# Patient Record
Sex: Male | Born: 1935 | Race: White | Hispanic: No | Marital: Married | Smoking: Never smoker
Health system: Southern US, Community
[De-identification: ages and names within clinical notes are randomized; demographics above are authoritative.]

## PROBLEM LIST (undated history)

## (undated) DIAGNOSIS — I4891 Unspecified atrial fibrillation: Secondary | ICD-10-CM

## (undated) DIAGNOSIS — H9190 Unspecified hearing loss, unspecified ear: Secondary | ICD-10-CM

## (undated) DIAGNOSIS — K219 Gastro-esophageal reflux disease without esophagitis: Secondary | ICD-10-CM

## (undated) DIAGNOSIS — I10 Essential (primary) hypertension: Secondary | ICD-10-CM

## (undated) DIAGNOSIS — Z87442 Personal history of urinary calculi: Secondary | ICD-10-CM

## (undated) DIAGNOSIS — B1910 Unspecified viral hepatitis B without hepatic coma: Secondary | ICD-10-CM

## (undated) DIAGNOSIS — F0390 Unspecified dementia without behavioral disturbance: Secondary | ICD-10-CM

## (undated) DIAGNOSIS — Z7901 Long term (current) use of anticoagulants: Secondary | ICD-10-CM

## (undated) DIAGNOSIS — I509 Heart failure, unspecified: Secondary | ICD-10-CM

## (undated) DIAGNOSIS — C4490 Unspecified malignant neoplasm of skin, unspecified: Secondary | ICD-10-CM

## (undated) DIAGNOSIS — R6251 Failure to thrive (child): Secondary | ICD-10-CM

## (undated) DIAGNOSIS — M1990 Unspecified osteoarthritis, unspecified site: Secondary | ICD-10-CM

## (undated) HISTORY — PX: LITHOTRIPSY: SUR834

## (undated) HISTORY — PX: CARDIOVERSION: SHX1299

## (undated) HISTORY — PX: CYSTOSCOPY W/ STONE MANIPULATION: SHX1427

## (undated) HISTORY — PX: SKIN CANCER EXCISION: SHX779

---

## 2017-01-10 ENCOUNTER — Ambulatory Visit (INDEPENDENT_AMBULATORY_CARE_PROVIDER_SITE_OTHER): Payer: No Typology Code available for payment source

## 2017-01-10 ENCOUNTER — Ambulatory Visit (INDEPENDENT_AMBULATORY_CARE_PROVIDER_SITE_OTHER): Payer: Medicare Other | Admitting: Orthopaedic Surgery

## 2017-01-10 DIAGNOSIS — S42352D Displaced comminuted fracture of shaft of humerus, left arm, subsequent encounter for fracture with routine healing: Secondary | ICD-10-CM

## 2017-01-10 DIAGNOSIS — M79622 Pain in left upper arm: Secondary | ICD-10-CM

## 2017-01-10 DIAGNOSIS — M898X2 Other specified disorders of bone, upper arm: Secondary | ICD-10-CM

## 2017-01-10 NOTE — Progress Notes (Signed)
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 He unfortunately has some mild dementia in about a month ago sustained a significant mechanical fall injuring his left humeral shaft. He's been treated appropriately in splinting for this type of fracture that I was able to review his x-rays and see the had but there was significant swelling to be expected bruising as well as edema in the soft tissues with drainage. He's been in a sling and swath as well as dressing changes. His wife brought him to be adequate concern about his fracture in his care overall. I gave her reassurance that this is a fracture that I would even try to treat nonoperatively as well given the extent of the fracture and how these can do over time with healing.  On exam there significant bruising around his humerus, chest, shoulder and down the arm. His hand is well-perfused on the left side. He is unable to extend his fingers and wrist on the left side suggesting a radial nerve injury.  2 views of the humerus obtained and I share with him and his wife the x-ray showing a significant humeral shaft fracture. The overall gross alignment well-maintained.  This is a a fracture that I would treat nonoperatively as well. At a month though I can place him in a Sarmiento fracture brace and I didn't place his brace today and he was comfortable in this. I will give the nursing facility to have him work on range of motion of his elbow wrist and hand and I like see him back in 3 weeks with a repeat AP and lateral of the left humerus out of his splint. All questions were encouraged and answered.

## 2017-01-23 ENCOUNTER — Encounter (HOSPITAL_COMMUNITY): Payer: Self-pay | Admitting: Neurology

## 2017-01-23 ENCOUNTER — Other Ambulatory Visit: Payer: Self-pay

## 2017-01-23 ENCOUNTER — Emergency Department (HOSPITAL_COMMUNITY): Payer: Medicare Other

## 2017-01-23 ENCOUNTER — Observation Stay (HOSPITAL_COMMUNITY)
Admission: EM | Admit: 2017-01-23 | Discharge: 2017-01-24 | Disposition: A | Discharge status: Home / Self Care | Payer: Medicare Other | Attending: Internal Medicine | Admitting: Internal Medicine

## 2017-01-23 ENCOUNTER — Ambulatory Visit: Payer: Medicare Other | Admitting: Podiatry

## 2017-01-23 DIAGNOSIS — S42292D Other displaced fracture of upper end of left humerus, subsequent encounter for fracture with routine healing: Secondary | ICD-10-CM | POA: Diagnosis not present

## 2017-01-23 DIAGNOSIS — M1990 Unspecified osteoarthritis, unspecified site: Secondary | ICD-10-CM | POA: Insufficient documentation

## 2017-01-23 DIAGNOSIS — Z7901 Long term (current) use of anticoagulants: Secondary | ICD-10-CM | POA: Insufficient documentation

## 2017-01-23 DIAGNOSIS — R55 Syncope and collapse: Principal | ICD-10-CM | POA: Diagnosis present

## 2017-01-23 DIAGNOSIS — I429 Cardiomyopathy, unspecified: Secondary | ICD-10-CM | POA: Diagnosis not present

## 2017-01-23 DIAGNOSIS — X58XXXD Exposure to other specified factors, subsequent encounter: Secondary | ICD-10-CM | POA: Diagnosis not present

## 2017-01-23 DIAGNOSIS — Z79899 Other long term (current) drug therapy: Secondary | ICD-10-CM | POA: Insufficient documentation

## 2017-01-23 DIAGNOSIS — I700 Atherosclerosis of aorta: Secondary | ICD-10-CM | POA: Diagnosis not present

## 2017-01-23 DIAGNOSIS — I110 Hypertensive heart disease with heart failure: Secondary | ICD-10-CM | POA: Insufficient documentation

## 2017-01-23 DIAGNOSIS — I5020 Unspecified systolic (congestive) heart failure: Secondary | ICD-10-CM | POA: Insufficient documentation

## 2017-01-23 DIAGNOSIS — I081 Rheumatic disorders of both mitral and tricuspid valves: Secondary | ICD-10-CM | POA: Diagnosis not present

## 2017-01-23 DIAGNOSIS — N390 Urinary tract infection, site not specified: Secondary | ICD-10-CM | POA: Diagnosis not present

## 2017-01-23 DIAGNOSIS — Z885 Allergy status to narcotic agent status: Secondary | ICD-10-CM | POA: Diagnosis not present

## 2017-01-23 DIAGNOSIS — Z87442 Personal history of urinary calculi: Secondary | ICD-10-CM | POA: Insufficient documentation

## 2017-01-23 DIAGNOSIS — I4891 Unspecified atrial fibrillation: Secondary | ICD-10-CM

## 2017-01-23 DIAGNOSIS — T17908A Unspecified foreign body in respiratory tract, part unspecified causing other injury, initial encounter: Secondary | ICD-10-CM

## 2017-01-23 DIAGNOSIS — Z85828 Personal history of other malignant neoplasm of skin: Secondary | ICD-10-CM | POA: Diagnosis not present

## 2017-01-23 DIAGNOSIS — I481 Persistent atrial fibrillation: Secondary | ICD-10-CM | POA: Insufficient documentation

## 2017-01-23 DIAGNOSIS — I509 Heart failure, unspecified: Secondary | ICD-10-CM | POA: Diagnosis not present

## 2017-01-23 DIAGNOSIS — E860 Dehydration: Secondary | ICD-10-CM

## 2017-01-23 HISTORY — DX: Unspecified viral hepatitis B without hepatic coma: B19.10

## 2017-01-23 HISTORY — DX: Unspecified malignant neoplasm of skin, unspecified: C44.90

## 2017-01-23 HISTORY — DX: Gastro-esophageal reflux disease without esophagitis: K21.9

## 2017-01-23 HISTORY — DX: Essential (primary) hypertension: I10

## 2017-01-23 HISTORY — DX: Long term (current) use of anticoagulants: Z79.01

## 2017-01-23 HISTORY — DX: Failure to thrive (child): R62.51

## 2017-01-23 HISTORY — DX: Heart failure, unspecified: I50.9

## 2017-01-23 HISTORY — DX: Unspecified atrial fibrillation: I48.91

## 2017-01-23 HISTORY — DX: Unspecified dementia, unspecified severity, without behavioral disturbance, psychotic disturbance, mood disturbance, and anxiety: F03.90

## 2017-01-23 HISTORY — DX: Unspecified osteoarthritis, unspecified site: M19.90

## 2017-01-23 HISTORY — DX: Unspecified hearing loss, unspecified ear: H91.90

## 2017-01-23 HISTORY — DX: Personal history of urinary calculi: Z87.442

## 2017-01-23 LAB — CBC
HCT: 49.8 % (ref 39.0–52.0)
HEMOGLOBIN: 17.4 g/dL — AB (ref 13.0–17.0)
MCH: 33.2 pg (ref 26.0–34.0)
MCHC: 34.9 g/dL (ref 30.0–36.0)
MCV: 95 fL (ref 78.0–100.0)
Platelets: 140 10*3/uL — ABNORMAL LOW (ref 150–400)
RBC: 5.24 MIL/uL (ref 4.22–5.81)
RDW: 16.3 % — ABNORMAL HIGH (ref 11.5–15.5)
WBC: 9.1 10*3/uL (ref 4.0–10.5)

## 2017-01-23 LAB — BASIC METABOLIC PANEL
ANION GAP: 10 (ref 5–15)
BUN: 30 mg/dL — ABNORMAL HIGH (ref 6–20)
CALCIUM: 8.8 mg/dL — AB (ref 8.9–10.3)
CHLORIDE: 112 mmol/L — AB (ref 101–111)
CO2: 21 mmol/L — AB (ref 22–32)
Creatinine, Ser: 1.47 mg/dL — ABNORMAL HIGH (ref 0.61–1.24)
GFR calc Af Amer: 48 mL/min — ABNORMAL LOW (ref >60)
GFR calc non Af Amer: 42 mL/min — ABNORMAL LOW (ref >60)
GLUCOSE: 114 mg/dL — AB (ref 65–99)
Potassium: 3.8 mmol/L (ref 3.5–5.1)
Sodium: 143 mmol/L (ref 135–145)

## 2017-01-23 LAB — PROTIME-INR
INR: 2.46
PROTHROMBIN TIME: 27.1 s — AB (ref 11.4–15.2)

## 2017-01-23 MED ORDER — SODIUM CHLORIDE 0.9% FLUSH
3.0000 mL | Freq: Two times a day (BID) | INTRAVENOUS | Status: DC
  Administered 2017-01-24: 3 mL via INTRAVENOUS

## 2017-01-23 MED ORDER — SOTALOL HCL 120 MG PO TABS: 120.0000 mg | ORAL_TABLET | Freq: Two times a day (BID) | ORAL | Status: DC

## 2017-01-23 MED ORDER — SODIUM CHLORIDE 0.9% FLUSH
3.0000 mL | Freq: Two times a day (BID) | INTRAVENOUS | Status: DC
  Administered 2017-01-23 – 2017-01-24 (×2): 3 mL via INTRAVENOUS

## 2017-01-23 MED ORDER — SODIUM CHLORIDE 0.9 % IV BOLUS (SEPSIS): 1000.0000 mL | Freq: Once | INTRAVENOUS | Status: DC

## 2017-01-23 MED ORDER — TRAMADOL HCL 50 MG PO TABS: 50.0000 mg | ORAL_TABLET | Freq: Four times a day (QID) | ORAL | Status: DC | PRN

## 2017-01-23 MED ORDER — MEGESTROL ACETATE 40 MG/ML PO SUSP: 400.0000 mg | ORAL | Status: DC

## 2017-01-23 MED ORDER — ACETAMINOPHEN 500 MG PO TABS
1000.0000 mg | ORAL_TABLET | Freq: Four times a day (QID) | ORAL | Status: DC | PRN
Start: 2017-01-23 — End: 2017-01-24
  Administered 2017-01-23: 1000 mg via ORAL
  Filled 2017-01-23: qty 2

## 2017-01-23 MED ORDER — WARFARIN SODIUM 2.5 MG PO TABS
2.5000 mg | ORAL_TABLET | Freq: Every day | ORAL | Status: DC
  Administered 2017-01-23 – 2017-01-24 (×2): 2.5 mg via ORAL
  Filled 2017-01-23 (×2): qty 1

## 2017-01-23 MED ORDER — DILTIAZEM HCL 60 MG PO TABS
60.0000 mg | ORAL_TABLET | Freq: Three times a day (TID) | ORAL | Status: DC
  Administered 2017-01-23 – 2017-01-24 (×4): 60 mg via ORAL
  Filled 2017-01-23 (×5): qty 1

## 2017-01-23 MED ORDER — WARFARIN - PHYSICIAN DOSING INPATIENT: Freq: Every day | Status: DC

## 2017-01-23 MED ORDER — SODIUM CHLORIDE 0.9% FLUSH: 3.0000 mL | INTRAVENOUS | Status: DC | PRN

## 2017-01-23 MED ORDER — SOTALOL HCL 80 MG PO TABS
120.0000 mg | ORAL_TABLET | Freq: Two times a day (BID) | ORAL | Status: DC
  Administered 2017-01-23 – 2017-01-24 (×3): 120 mg via ORAL
  Filled 2017-01-23: qty 1
  Filled 2017-01-23 (×2): qty 2
  Filled 2017-01-23: qty 1

## 2017-01-23 MED ORDER — DIGOXIN 125 MCG PO TABS
0.1250 mg | ORAL_TABLET | Freq: Every day | ORAL | Status: DC
  Administered 2017-01-23 – 2017-01-24 (×2): 0.125 mg via ORAL
  Filled 2017-01-23 (×2): qty 1

## 2017-01-23 MED ORDER — SODIUM CHLORIDE 0.9 % IV SOLN: 250.0000 mL | INTRAVENOUS | Status: DC | PRN

## 2017-01-23 MED ORDER — CELECOXIB 100 MG PO CAPS
100.0000 mg | ORAL_CAPSULE | Freq: Two times a day (BID) | ORAL | Status: DC
  Administered 2017-01-23 – 2017-01-24 (×2): 100 mg via ORAL
  Filled 2017-01-23 (×3): qty 1

## 2017-01-23 MED ORDER — FUROSEMIDE 40 MG PO TABS
40.0000 mg | ORAL_TABLET | Freq: Two times a day (BID) | ORAL | Status: DC
  Administered 2017-01-23 (×2): 40 mg via ORAL
  Filled 2017-01-23 (×2): qty 2

## 2017-01-23 NOTE — ED Notes (Signed)
Dr. Kathrynn Humble reports patient can eat. Meal tray ordered, will get apple sauce, more juice. Also med, hx has been done, so home meds can be ordered. AFIB HR fluctuating 105-130.

## 2017-01-23 NOTE — H&P (Signed)
Date: 01/23/2017               Patient Name:  Devin Scott MRN: [id number]  DOB: [birth year] Age / Sex: 83 y.o., male   PCP: Nicola Girt, DO         Medical Service: Internal Medicine Teaching Service         Attending Physician: Dr. Oval Linsey, MD    First Contact: Dr. Colbert Ewing Pager: [phone]  Second Contact: Dr. Maryellen Pile Pager: [phone]       After Hours (After 5p/  First Contact Pager: [phone]  weekends / holidays): Second Contact Pager: [phone]   Chief Complaint: syncopal episode  History of Present Illness: Devin Scott is an 83yo male with PMH significant for NICM (EF 20%), cognitive impairment, afib (on warfarin), and HTN who presents from his facility for syncopal episode this morning. History obtained from patient and wife, although wife was not present during the syncopal episode and patient does not fully remember the details.  Patient was reportedly sitting in his wheelchair waiting for his appointment when he was found to be unresponsive and slumped over. Nurse had a hard time feeling his pulse. They laid him back placed patient back in bed, but he became more responsive and started feeling better. Patient states that he felt very weak and felt like he was going to pass out but never actually did pass out. Wife says she arrived around 15 minutes after this happened and he was back to his normal state lying in bed. Wife also says that he still had egg in his mouth. Patient denies CP, palpitations, vision changes, or dizziness.  In the ER, he was noted to be in afib with RVR (HR 120s-140s). Temp 97.4, RR 14, BP 99/76, 97% on RA. Given home diltiazem 60mg  and sotalol 120mg , as well as digoxin 0.125mg . HR down to 80s to 100s. During the time of my interview, patient was feeling fine. No recurrence of syncope.  Meds:  Current Meds  Medication Sig  . acetaminophen (TYLENOL) 500 MG tablet Take 1,000 mg by mouth 3 (three) times daily.  . celecoxib  (CELEBREX) 100 MG capsule Take 100 mg by mouth 2 (two) times daily.  . digoxin (LANOXIN) 0.125 MG tablet Take 0.125 mg by mouth daily.  Marland Kitchen diltiazem (CARDIZEM) 60 MG tablet Take 60 mg by mouth 3 (three) times daily.  . furosemide (LASIX) 40 MG tablet Take 40 mg by mouth 2 (two) times daily.  . megestrol (MEGACE) 40 MG/ML suspension Take 400 mg by mouth every other day.  Marland Kitchen omeprazole (PRILOSEC) 40 MG capsule Take 40 mg by mouth daily.  Marland Kitchen OVER THE COUNTER MEDICATION Take 120 mLs by mouth 3 (three) times daily.  . sotalol (BETAPACE) 120 MG tablet Take 120 mg by mouth 2 (two) times daily.  . traMADol (ULTRAM) 50 MG tablet Take 50 mg by mouth every 6 (six) hours as needed for moderate pain.  Marland Kitchen warfarin (COUMADIN) 2.5 MG tablet Take 2.5 mg by mouth daily.   Allergies: Allergies as of 01/23/2017  . (No Known Allergies)   Past Medical History:  Diagnosis Date  . Arthritis   . Atrial fibrillation ([place])   . Chronic anticoagulation   . Failure to thrive (0-17)   . Hard of hearing   . Hypertension   . Kidney stone   . Kidney stone   . Skin cancer    Family History: No family history on file.  Social History: Social  History   Social History  . Marital status: Unknown    Spouse name: N/A  . Number of children: N/A  . Years of education: N/A   Occupational History  . Not on file.   Social History Main Topics  . Smoking status: Never Smoker  . Smokeless tobacco: Not on file  . Alcohol use No  . Drug use: Unknown  . Sexual activity: Not on file   Other Topics Concern  . Not on file   Social History Narrative  . No narrative on file   Review of Systems: Constitutional: Negative for diaphoresis, fever, malaise/fatigue, and weight loss. HEENT: Negative for blurred vision, sinus pain, congestion, and sore throat. Positive for hearing loss. Respiratory: Negative for cough, shortness of breath and wheezing. Cardiovascular: Negative for chest pain, palpitations, orthopnea, PND, and  leg swelling. Gastrointestinal: Negative for abdominal pain, blood in stool, constipation, diarrhea, heartburn, nausea and vomiting. Genitourinary: Negative for dysuria and hematuria. Musculoskeletal: Negative for joint pain and myalgias. Neurological: Negative for dizziness, focal weakness, weakness and headaches.  Physical Exam: Blood pressure 100/72, pulse 67, temperature (!) 97.4 F (36.3 C), temperature source Oral, resp. rate 16, SpO2 96 %. GEN: Well-appearing elderly gentleman lying in bed in NAD HENT: Moist mucous membranes. No visible lesions. EYES: EOMI. Sclera anicteric. RESP: Clear to auscultation bilaterally. No wheezes or rales. CV: Irregularly irregular rhythm. Tachycardic. No murmurs, gallops, or rubs. No JVD elevation appreciated. 3+ BLE edema to knees. ABD: Soft. Non-tender. Non-distended. Normoactive bowel sounds. EXT: BLE edema to knees. 1+ DP and radial pulses. Minimal movement of L arm (recent history of L humeral fracture). NEURO: A&Ox2 (did not know the month). Mild hearing loss. Other cranial nerves grossly intact. No focal deficits or weakness.  EKG: Afib with RVR  CXR: Focal consolidation left base. Question aspiration as a differential consideration. Small left pleural effusion. Small right pleural effusion. Cardiomegaly with aortic atherosclerosis. Comminuted fracture proximal mid left humerus. Aortic Atherosclerosis  Assessment & Plan by Problem: Active Problems:   Syncope  # Syncope with ?LOC Etiology: Dehydration vs afib. History is difficult to obtain from patient and wife. One episode of syncope this morning. Patient does seem dry on exam. Cr 1.47 and BUN 30 (baseline Cr in 1-1.3 range). Patient also has a history of persistent atrial fibrillation and was noted to be in afib with RVR in the ER. No other electrolyte abnormalities. History does not really fit a vasovagal etiology. Patient does not recall passing out, and is feeling back to normal  now. - Orthostatics - Echo - Continue digoxin, sotalol, and diltiazem - Telemetry - UA - IVF  # Persistent Afib with RVR HR improved after diltiazem, digoxin, and sotalol. Patient denies chest pain or palpitations. - Continue digoxin, sotalol, and diltiazem - Telemetry - Cont home warfarin  # HFrEF (last EF 20%) Patient with history of chronic HFrEF. Last EF from note in 09/2016 was reportedly 20%. Patient has refused ICD in prior discussions. Received 1 dose of lasix in ER. Unclear whether he is actually volume overloaded. - Repeat echo - Orthostatics  # HTN History of HTN per chart. Blood pressures low in the hospital (100s/80s). - Hold anti-hypertensives for now.  Dispo: Admit patient to Observation with expected length of stay less than 2 midnights.  Signed: Colbert Ewing, MD  Internal Medicine, PGY-1 01/23/2017, 4:49 PM  Pager: Mamie Nick [phone]

## 2017-01-23 NOTE — ED Triage Notes (Signed)
Per ems- pt comes from [facility], today he reports they took him eat, he was sitting up. He said he was feeling well, and felt he was going to pass out. They made him eat. He had syncopal episode, went unresponsive, apneic, weak pulse. Ems arrived, he was alert and oriented. Skin color intact, delayed cap refill. BP 104/58, HR 124 AFIB, CBG 122. Has been having low BP, and doctor has been unable to get BP higher. 20 gauge R. Forearm.   Given 400 NS cc

## 2017-01-23 NOTE — ED Notes (Signed)
Attempted to call report; nurse did not answer phone. Secretary states will have nurse call me back.

## 2017-01-23 NOTE — ED Provider Notes (Signed)
[facility] DEPT Provider Note   CSN: [id number] Arrival date & time: 01/23/17  9381     History   Chief Complaint Chief Complaint  Patient presents with  . Loss of Consciousness    HPI Devin Scott is a 83 y.o. male.  HPI Pt with hx of AF comes in with cc of syncope. Pt also has HTN, and hearing problems. I called [facility] on [address] and they report that pt was waiting in wheelchair for his appointment when the med tech went to give him his meds and noted that pt was unresponsive. Charge nurse went to attend the patient and he was appeic, pale and had faint pulse. They placed the patient in the bed, thinking he was going to need CPR - but pt started getting better and more responsive.  Pt has no complains. He doesn't recall passing out.   Past Medical History:  Diagnosis Date  . Arthritis   . Atrial fibrillation ([place])   . Chronic anticoagulation   . Failure to thrive (0-17)   . Hard of hearing   . Hypertension   . Kidney stone   . Kidney stone   . Skin cancer     Patient Active Problem List   Diagnosis Date Noted  . Displaced comminuted fracture of shaft of left humerus with routine healing 01/10/2017    Past Surgical History:  Procedure Laterality Date  . CARDIOVERSION    . KIDNEY STONE SURGERY         Home Medications    Prior to Admission medications   Medication Sig Start Date End Date Taking? Authorizing Provider  acetaminophen (TYLENOL) 500 MG tablet Take 1,000 mg by mouth 3 (three) times daily.   Yes [provider]  celecoxib (CELEBREX) 100 MG capsule Take 100 mg by mouth 2 (two) times daily.   Yes [provider]  digoxin (LANOXIN) 0.125 MG tablet Take 0.125 mg by mouth daily.   Yes [provider]  diltiazem (CARDIZEM) 60 MG tablet Take 60 mg by mouth 3 (three) times daily.   Yes [provider]  furosemide (LASIX) 40 MG tablet Take 40 mg by mouth 2 (two) times daily.   Yes [provider]    megestrol (MEGACE) 40 MG/ML suspension Take 400 mg by mouth every other day.   Yes [provider]  omeprazole (PRILOSEC) 40 MG capsule Take 40 mg by mouth daily.   Yes [provider]  OVER THE COUNTER MEDICATION Take 120 mLs by mouth 3 (three) times daily.   Yes [provider]  sotalol (BETAPACE) 120 MG tablet Take 120 mg by mouth 2 (two) times daily.   Yes [provider]  traMADol (ULTRAM) 50 MG tablet Take 50 mg by mouth every 6 (six) hours as needed for moderate pain.   Yes [provider]  warfarin (COUMADIN) 2.5 MG tablet Take 2.5 mg by mouth daily.   Yes [provider]    Family History No family history on file.  Social History Social History  Substance Use Topics  . Smoking status: Never Smoker  . Smokeless tobacco: Not on file  . Alcohol use No     Allergies   Patient has no known allergies.   Review of Systems Review of Systems  Constitutional: Positive for activity change.  Respiratory: Negative for shortness of breath.   Cardiovascular: Negative for chest pain.  Allergic/Immunologic: Negative for immunocompromised state.  Neurological: Positive for syncope.  All other systems reviewed and are  negative.    Physical Exam Updated Vital Signs BP 99/63   Pulse 86   Temp (!) 97.4 F (36.3 C) (Oral)   Resp 16   SpO2 96%   Physical Exam  Constitutional: He is oriented to person, place, and time. He appears well-developed.  HENT:  Head: Atraumatic.  Neck: Neck supple.  Cardiovascular: Regular rhythm.   Irregular rhythm  Pulmonary/Chest: Effort normal.  Abdominal: Soft.  Musculoskeletal: He exhibits no edema.  Neurological: He is alert and oriented to person, place, and time.  Skin: Skin is warm.  Nursing note and vitals reviewed.    ED Treatments / Results  Labs (all labs ordered are listed, but only abnormal results are displayed) Labs Reviewed  BASIC METABOLIC PANEL - Abnormal; Notable  for the following:       Result Value   Chloride 112 (*)    CO2 21 (*)    Glucose, Bld 114 (*)    BUN 30 (*)    Creatinine, Ser 1.47 (*)    Calcium 8.8 (*)    GFR calc non Af Amer 42 (*)    GFR calc Af Amer 48 (*)    All other components within normal limits  CBC - Abnormal; Notable for the following:    Hemoglobin 17.4 (*)    RDW 16.3 (*)    Platelets 140 (*)    All other components within normal limits  PROTIME-INR - Abnormal; Notable for the following:    Prothrombin Time 27.1 (*)    All other components within normal limits  URINALYSIS, ROUTINE W REFLEX MICROSCOPIC    EKG  EKG Interpretation  Date/Time:  Monday January 23 2017 09:39:32 EDT Ventricular Rate:  127 PR Interval:    QRS Duration: 84 QT Interval:  310 QTC Calculation: 450 R Axis:   61 Text Interpretation:  Atrial fibrillation with rapid ventricular response ST & T wave abnormality, consider anterior ischemia Abnormal ECG No acute changes No old tracing to compare Confirmed by Varney Biles ([id number]) on 01/23/2017 10:37:02 AM       Radiology Dg Chest 1 View  Result Date: 01/23/2017 CLINICAL DATA:  Hypertension.  Aspiration EXAM: CHEST 1 VIEW COMPARISON:  None. FINDINGS: There is a focus of consolidation in the left base, concerning for pneumonia or aspiration. There is a small pleural effusion on the left. There is a small right pleural effusion without edema or consolidation appreciable. There is cardiomegaly with pulmonary vascularity within normal limits. There is aortic atherosclerosis. No adenopathy. Bones are osteoporotic. There is a comminuted fracture of the left proximal to mid humerus. IMPRESSION: Focal consolidation left base. Question aspiration as a differential consideration. Small left pleural effusion. Small right pleural effusion. Cardiomegaly with aortic atherosclerosis. Comminuted fracture proximal mid left humerus. Aortic Atherosclerosis (ICD10-I70.0). Electronically Signed   By: Lowella Grip  III M.D.   On: 01/23/2017 11:35    Procedures Procedures (including critical care time)  Medications Ordered in ED Medications  digoxin (LANOXIN) tablet 0.125 mg (0.125 mg Oral Given 01/23/17 1435)  diltiazem (CARDIZEM) tablet 60 mg (60 mg Oral Given 01/23/17 1429)  furosemide (LASIX) tablet 40 mg (40 mg Oral Given 01/23/17 1436)  megestrol (MEGACE) 40 MG/ML suspension 400 mg (not administered)  warfarin (COUMADIN) tablet 2.5 mg (not administered)  sotalol (BETAPACE) tablet 120 mg (120 mg Oral Given 01/23/17 1520)     Initial Impression / Assessment and Plan / ED Course  I have reviewed the triage vital signs and the nursing notes.  Pertinent  labs & imaging results that were available during my care of the patient were reviewed by me and considered in my medical decision making (see chart for details).     Pt comes in after an episode of syncope.  The nursing home, for all that it is worth - reports that pt was unconscious, apneic,  Had a faint pulse and they were seconds away from CPR. On my exam, pt is ao x 3. Pt doesn't recall what happened. He doesn't think he fainted. He is noted to be tachycardic and in RVR. The wife, who comes to the bedside later on, has a different version of the story.  Pt has CAD hx, AF hx - and it seems like he was unconscious. We will tx him like syncope and admit.   Final Clinical Impressions(s) / ED Diagnoses   Final diagnoses:  Syncope and collapse  Atrial fibrillation with RVR (HCC)    New Prescriptions New Prescriptions   No medications on file     Varney Biles, MD 01/23/17 [id number]

## 2017-01-23 NOTE — ED Notes (Signed)
Pt given orange juice. Pharmacy working to get accurate med list from facility. Wife at bedside.

## 2017-01-23 NOTE — Discharge Summary (Addendum)
Name: Devin Scott MRN: [id number] DOB: July 17, 1928 83 y.o. PCP: Nicola Girt, DO  Date of Admission: 01/23/2017  9:43 AM Date of Discharge: 01/24/2017 Attending Physician: Oval Linsey, MD  Discharge Diagnosis: 1. Syncope 2. Symptomatic UTI 3. Persistent atrial fibrillation with RVR  Active Problems:   Syncope   Discharge Medications: Allergies as of 01/24/2017      Reactions   Percocet [oxycodone-acetaminophen]    "DO NOT GIVE PERCOCET"/spouse.        Medication List    STOP taking these medications   furosemide 40 MG tablet Commonly known as:  LASIX   OVER THE COUNTER MEDICATION     TAKE these medications   acetaminophen 500 MG tablet Commonly known as:  TYLENOL Take 1,000 mg by mouth 3 (three) times daily.   cefdinir 300 MG capsule Commonly known as:  OMNICEF Take 1 capsule (300 mg total) by mouth daily.   celecoxib 100 MG capsule Commonly known as:  CELEBREX Take 100 mg by mouth 2 (two) times daily.   digoxin 0.125 MG tablet Commonly known as:  LANOXIN Take 0.125 mg by mouth daily.   diltiazem 60 MG tablet Commonly known as:  CARDIZEM Take 60 mg by mouth 3 (three) times daily.   megestrol 40 MG/ML suspension Commonly known as:  MEGACE Take 400 mg by mouth every other day.   omeprazole 40 MG capsule Commonly known as:  PRILOSEC Take 40 mg by mouth daily.   sotalol 120 MG tablet Commonly known as:  BETAPACE Take 120 mg by mouth 2 (two) times daily.   traMADol 50 MG tablet Commonly known as:  ULTRAM Take 50 mg by mouth every 6 (six) hours as needed for moderate pain.   warfarin 2.5 MG tablet Commonly known as:  COUMADIN Take 2.5 mg by mouth daily.       Disposition and follow-up:   Mr.Torian Caspers was discharged from [facility] in Stable condition.  At the hospital follow up visit please address:  1.  - Any more syncopal episodes? - Has he been eating and drinking okay? - Any more burning or pain with  urination?  2. He continues to be in atrial fibrillation. We are discharging him on his home regimen of diltiazem, sotalol, and digoxin. We considered discontinuing his sotalol because if he was on it for rhythm control, the sotalol does not seem to be helping. However, we do not know his full history and are not his primary care provider. For example, he may be on the sotalol for vtach. Telemetry without ventricular arrhythmias here, but we do not know his full history. Thus, we are deferring the decision to potentially discontinue his sotalol. Can consider discontinuing sotalol if appropriate.  3.  Labs / imaging needed at time of follow-up: None  4.  Pending labs/ test needing follow-up: Urine culture, echo  Follow-up Appointments: Follow-up Information    Doug Sou B, DO Follow up in 1 week(s).   Specialty:  Internal Medicine Contact information: [address] [place] [address] [place] [facility] Course by problem list: Active Problems:   Syncope   1. Syncope, etiology likely from dehydration from recent addition of lasix Patient admitted on 8/6 from facility for syncopal episode this morning. Found to be in afib with RVR (HR 120s-140s). Received home diltiazem, sotalol, and digoxin with improvement of HR to 80s to 100s. Glucose normal. No other electrolyte abnormalities. Not  likely vasovagal, but difficult to ascertain because patient has difficulty recalling the episode. Telemetry without ventricular arrhythmias. According to his wife, he was started on lasix in the last week. It is unclear why he was started on this, but we suspect that he may have been over-diuresed and become dehydrated, leading to his feeling weak and syncopal episode. He received IVF in the hospital and is feeling back to normal without recurrent syncopal episodes.  2. Symptomatic UTI Patient notes some burning with urination. UA with moderate leukocytes, 6-30 WBC,  few bacteria, and negative nitrites. Gave him 1 dose of IV ceftriaxone in the hospital (because we do not have an oral form of cefdinir in the hospital formulary). Discharged home with oral cefdinir 300mg  for 5 days.  3. Persistent afib with RVR  HR improved after diltiazem, digoxin, and sotalol. Patient denies chest pain or palpitations. Telemetry without ventricular arrhythmias in the hospital. We considered discontinuing the sotalol if it was for rhythm control of afib, however we have decided to defer this decision to his PCP. Consider discontinuing sotalol if able.  CHADS2-VASc score is 4. Will discharge on home warfarin.  4. HFrEF (last EF 20%) Per wife, patient was in the process of discussing possible ICD/pacemaker placement. However, his humeral fracture and subsequent events precluded continuation of that discussion. He denied PND or orthopnea. Had lower extremity edema, however we suspect this is from chronic venous insufficiency more than heart failure given his lack of other symptoms. Discharged without lasix. He did not seem volume overloaded on exam during this admission and we believe the lasix may have contributed to his weakness and syncopal episode.  Discharge Vitals:   BP 112/81 (BP Location: Right Arm)   Pulse 88   Temp 98.2 F (36.8 C) (Oral)   Resp 18   Wt 158 lb 9.6 oz (71.9 kg)   SpO2 99%   Pertinent Labs, Studies, and Procedures:  CBC    Component Value Date/Time   WBC 9.1 01/23/2017 1054   RBC 5.24 01/23/2017 1054   HGB 17.4 (H) 01/23/2017 1054   HCT 49.8 01/23/2017 1054   PLT 140 (L) 01/23/2017 1054   MCV 95.0 01/23/2017 1054   MCH 33.2 01/23/2017 1054   MCHC 34.9 01/23/2017 1054   RDW 16.3 (H) 01/23/2017 1054   BMET    Component Value Date/Time   NA 141 01/24/2017 0144   K 3.4 (L) 01/24/2017 0144   CL 110 01/24/2017 0144   CO2 23 01/24/2017 0144   GLUCOSE 125 (H) 01/24/2017 0144   BUN 32 (H) 01/24/2017 0144   CREATININE 1.53 (H) 01/24/2017 0144    CALCIUM 8.4 (L) 01/24/2017 0144   GFRNONAA 40 (L) 01/24/2017 0144   GFRAA 46 (L) 01/24/2017 0144   INR 2.33, PT 26 Digoxin level 1.3 UA: small Hgb, negative nitrites, moderate leukocytes, 6-30 WBC, few bacteria  Discharge Instructions: Discharge Instructions    Call MD for:  difficulty breathing, headache or visual disturbances    Complete by:  As directed    Call MD for:  extreme fatigue    Complete by:  As directed    Call MD for:  persistant dizziness or light-headedness    Complete by:  As directed    Call MD for:  persistant nausea and vomiting    Complete by:  As directed    Call MD for:  severe uncontrolled pain    Complete by:  As directed    Call MD for:  temperature >100.4  Complete by:  As directed    Diet - low sodium heart healthy    Complete by:  As directed    Increase activity slowly    Complete by:  As directed      Signed: Colbert Ewing, MD  Internal Medicine, PGY-1 01/24/2017, 4:18 PM   Pager: Mamie Nick [phone]

## 2017-01-24 ENCOUNTER — Observation Stay (HOSPITAL_BASED_OUTPATIENT_CLINIC_OR_DEPARTMENT_OTHER): Payer: Medicare Other

## 2017-01-24 DIAGNOSIS — I5020 Unspecified systolic (congestive) heart failure: Secondary | ICD-10-CM

## 2017-01-24 DIAGNOSIS — Z7901 Long term (current) use of anticoagulants: Secondary | ICD-10-CM

## 2017-01-24 DIAGNOSIS — I481 Persistent atrial fibrillation: Secondary | ICD-10-CM | POA: Diagnosis not present

## 2017-01-24 DIAGNOSIS — I4891 Unspecified atrial fibrillation: Secondary | ICD-10-CM

## 2017-01-24 DIAGNOSIS — N390 Urinary tract infection, site not specified: Secondary | ICD-10-CM

## 2017-01-24 DIAGNOSIS — R55 Syncope and collapse: Secondary | ICD-10-CM | POA: Diagnosis not present

## 2017-01-24 DIAGNOSIS — I340 Nonrheumatic mitral (valve) insufficiency: Secondary | ICD-10-CM

## 2017-01-24 DIAGNOSIS — E860 Dehydration: Secondary | ICD-10-CM

## 2017-01-24 DIAGNOSIS — Z888 Allergy status to other drugs, medicaments and biological substances status: Secondary | ICD-10-CM

## 2017-01-24 LAB — URINALYSIS, ROUTINE W REFLEX MICROSCOPIC
BILIRUBIN URINE: NEGATIVE
GLUCOSE, UA: NEGATIVE mg/dL
KETONES UR: NEGATIVE mg/dL
Nitrite: NEGATIVE
PH: 7 (ref 5.0–8.0)
PROTEIN: NEGATIVE mg/dL
SQUAMOUS EPITHELIAL / LPF: NONE SEEN
Specific Gravity, Urine: 1.012 (ref 1.005–1.030)

## 2017-01-24 LAB — BASIC METABOLIC PANEL
ANION GAP: 8 (ref 5–15)
BUN: 32 mg/dL — ABNORMAL HIGH (ref 6–20)
CALCIUM: 8.4 mg/dL — AB (ref 8.9–10.3)
CO2: 23 mmol/L (ref 22–32)
CREATININE: 1.53 mg/dL — AB (ref 0.61–1.24)
Chloride: 110 mmol/L (ref 101–111)
GFR, EST AFRICAN AMERICAN: 46 mL/min — AB (ref >60)
GFR, EST NON AFRICAN AMERICAN: 40 mL/min — AB (ref >60)
GLUCOSE: 125 mg/dL — AB (ref 65–99)
Potassium: 3.4 mmol/L — ABNORMAL LOW (ref 3.5–5.1)
Sodium: 141 mmol/L (ref 135–145)

## 2017-01-24 LAB — DIGOXIN LEVEL: Digoxin Level: 1.3 ng/mL (ref 0.8–2.0)

## 2017-01-24 LAB — PROTIME-INR
INR: 2.33
Prothrombin Time: 26 seconds — ABNORMAL HIGH (ref 11.4–15.2)

## 2017-01-24 LAB — MRSA PCR SCREENING: MRSA BY PCR: NEGATIVE

## 2017-01-24 LAB — ECHOCARDIOGRAM COMPLETE: Weight: 2537.6 oz

## 2017-01-24 MED ORDER — DEXTROSE 5 % IV SOLN
1.0000 g | INTRAVENOUS | Status: AC
  Administered 2017-01-24: 1 g via INTRAVENOUS
  Filled 2017-01-24: qty 10

## 2017-01-24 MED ORDER — CEFDINIR 300 MG PO CAPS: 300.0000 mg | ORAL_CAPSULE | Freq: Every day | ORAL | 0 refills | Status: AC

## 2017-01-24 NOTE — Progress Notes (Signed)
Unable to do the orthostatic VS, pt unable to stand. Wife at the bedside also stated " he is very weak to stand." Will continue to monitor pt.

## 2017-01-24 NOTE — Clinical Social Work Placement (Signed)
   CLINICAL SOCIAL WORK PLACEMENT  NOTE  Date:  01/24/2017  Patient Details  Name: Devin Scott MRN: [id number] Date of Birth: 1928-06-26  Clinical Social Work is seeking post-discharge placement for this patient at the [place] level of care (*CSW will initial, date and re-position this form in  chart as items are completed):      Patient/family provided with [facility] Work Department's list of facilities offering this level of care within the geographic area requested by the patient (or if unable, by the patient's family).  Yes   Patient/family informed of their freedom to choose among providers that offer the needed level of care, that participate in Medicare, Medicaid or managed care program needed by the patient, have an available bed and are willing to accept the patient.      Patient/family informed of [facility]'s ownership interest in [facility] and [facility], as well as of the fact that they are under no obligation to receive care at these facilities.  PASRR submitted to EDS on       PASRR number received on 01/24/17     Existing PASRR number confirmed on       FL2 transmitted to all facilities in geographic area requested by pt/family on 01/24/17     FL2 transmitted to all facilities within larger geographic area on       Patient informed that his/her managed care company has contracts with or will negotiate with certain facilities, including the following:        Yes   Patient/family informed of bed offers received.  Patient chooses bed at       Physician recommends and patient chooses bed at      Patient to be transferred to   on  .  Patient to be transferred to facility by       Patient family notified on   of transfer.  Name of family member notified:        PHYSICIAN       Additional Comment:    _______________________________________________ Eileen Stanford, LCSW 01/24/2017, 11:23 AM

## 2017-01-24 NOTE — Clinical Social Work Note (Signed)
Clinical Social Worker facilitated patient discharge including contacting patient family and facility to confirm patient discharge plans.  Clinical information faxed to facility and family agreeable with plan.  CSW arranged ambulance transport via [facility] to [facility].  RN to call [phone] for report prior to discharge.  Clinical Social Worker will sign off for now as social work intervention is no longer needed. Please consult us again if new need arises.  Taysen Bushart, LCSWA [phone]   

## 2017-01-24 NOTE — Progress Notes (Signed)
Internal Medicine Attending  Date: 01/24/2017  Patient name: Devin Scott Medical record number: [id number] Date of birth: 25-May-1929 Age: 83 y.o. Gender: male  I saw and evaluated the patient. I reviewed the resident's note by Dr. Ronalee Red and I agree with the resident's findings and plans as documented in her progress note.  Please see my H&P dated 01/24/2017 for the specifics of my evaluation, assessment, and plan from earlier in the day.

## 2017-01-24 NOTE — Care Management Note (Signed)
Case Management Note  Patient Details  Name: Nuri Larmer MRN: [id number] Date of Birth: 09-29-1928  Subjective/Objective:                 Patient from [facility] SNF. In for observation for syncope. Spoke w wife, they anticipate DC back to [facility]   Action/Plan:  Anticipate return to SNF at DC Expected Discharge Date:                  Expected Discharge Plan:  [place]  In-House Referral:  Clinical Social Work  Discharge planning Services  CM Consult  Post Acute Care Choice:    Choice offered to:     DME Arranged:    DME Agency:     HH Arranged:    [place] Agency:     Status of Service:  In process, will continue to follow  If discussed at Long Length of Stay Meetings, dates discussed:    Additional Comments:  Carles Collet, RN 01/24/2017, 10:47 AM

## 2017-01-24 NOTE — Discharge Instructions (Signed)

## 2017-01-24 NOTE — H&P (Signed)
Internal Medicine Attending Admission Note Date: 01/24/2017  Patient name: Devin Scott Medical record number: [id number] Date of birth: 09/27/1928 Age: 83 y.o. Gender: male  I saw and evaluated the patient. I reviewed the resident's note and I agree with the resident's findings and plan as documented in the resident's note.  Chief Complaint(s): Possible syncope.  History - key components related to admission:  Devin Scott is an 83 year old man with a history of a nonischemic cardiomyopathy with a left ventricular ejection fraction of 20%, atrial fibrillation on chronic anticoagulation, hypertension, and recent left humeral fracture after a fall approximately 2 weeks ago who was in his usual state of health until about one week ago when presumably there was concern he was volume overloaded and was started on oral Lasix. He has subsequently become progressively more weak and we are told by his wife they had difficulty keeping his blood pressure up. On the morning of admission, the patient was being prepared for an appointment by placement into a wheelchair even though he states he was too weak to do so. He began to feel like he was going to pass out prior to this event. He was found in the wheelchair unresponsive with a weak pulse. He was placed back into bed where he recovered consciousness and quickly felt back to his baseline. He was therefore brought to the emergency department for further evaluation. He denied any chest pain, palpitations, shortness of breath, cough, headaches, or prior syncopal events. He did have some mild dysuria which was new. He also denied orthopnea or paroxysmal nocturnal dyspnea and admitted to being slightly thirsty. In the emergency department he was found to have a systolic blood pressure of approximately 100 and to be in atrial fibrillation with rapid ventricular rate in the 120-140 range. In addition, his BUN and creatinine was elevated at 30/1.5 and it was unclear what his  baseline is as we have no records. They attempted to do orthostatics in the emergency department but the patient refused to get up as he said he was too weak. Also of note, according to his wife, his INR has been in the therapeutic range recently on his chronic Coumadin. Because of the concern for possible syncope he was admitted to the internal medicine teaching service for further evaluation and care.  When seen on rounds the morning following admission he was without any acute complaints other than generalized weakness and felt he was back to his baseline.  Physical Exam - key components related to admission:  Vitals:   01/23/17 2045 01/23/17 2126 01/24/17 0103 01/24/17 0442  BP: 94/68 113/77 101/76 112/81  Pulse: (!) 58 86 (!) 115 88  Resp:  20 18 18   Temp:  98.3 F (36.8 C) 98.4 F (36.9 C) 98.2 F (36.8 C)  TempSrc:  Oral Oral Oral  SpO2: 97% 99% 99% 99%  Weight:    158 lb 9.6 oz (71.9 kg)   Gen.: Well-developed, well-nourished, elderly man who appeared frail lying comfortably flat in bed in no acute distress. He answered questions appropriately but was hard of hearing. Left upper extremity: Humeral plastic brace, diffuse edema with fragile skin with tearing.  Lab results:  Basic Metabolic Panel:  Recent Labs  01/23/17 1054 01/24/17 0144  NA 143 141  K 3.8 3.4*  CL 112* 110  CO2 21* 23  GLUCOSE 114* 125*  BUN 30* 32*  CREATININE 1.47* 1.53*  CALCIUM 8.8* 8.4*   CBC:  Recent Labs  01/23/17 1054  WBC 9.1  HGB 17.4*  HCT 49.8  MCV 95.0  PLT 140*   Coagulation:  Recent Labs  01/23/17 1054 01/24/17 0144  INR 2.46 2.33   Urinalysis:  Cloudy, yellow, specific gravity 1.012, pH 7.0, hemoglobin small, nitrite negative, leukocytes moderate, red blood cells 0-5 per high-power field, white blood cells 6-30 her high-power field.  Misc. Labs:  Urine culture pending Digoxin level I.3  Imaging results:   AP portable chest x-ray: Personally reviewed. Blunting  of the left costophrenic angle with the left diaphragm being difficult to make out which may suggest focal consolidation or chronic scarring. Fractured left humerus. No comparisons immediately available.  Other results:  HFW:YOVZCHYIFO reviewed. Atrial fibrillation at 127 bpm, normal axis, normal intervals, no significant Q waves, no LVH by voltage, early R wave progression, 1 mm ST depression in leads V3 through V6 with inferolateral T wave flattening. No comparisons immediately available.  Assessment & Plan by Problem:  Devin Scott is an 83 year old man with a history of a nonischemic cardiomyopathy with a left ventricular ejection fraction of 20%, atrial fibrillation on chronic anticoagulation, hypertension, and recent left humeral fracture after a fall approximately 2 weeks ago who was in his usual state of health until about one week ago when presumably there was concern he was volume overloaded and was started on oral Lasix. He presents with a presumed syncopal episode after one week of progressive weakness and dizziness associated with the initiation of Lasix and accompanied by persistent low blood pressures. Thus, I believe the most likely explanation for his syncopal episode was dehydration with orthostasis. Data supporting this are that he was lying completely flat without evidence of orthopnea or paroxysmal nocturnal dyspnea suggesting he does not have decompensated heart failure at this time, he has an elevated BUN and creatinine, he felt lightheaded just prior to the event when sitting in a chair, he has had hypotension by report for the last week, and is thirsty. I do not believe this was related to a ventricular arrhythmia as he had no ventricular arrhythmias on telemetry. That being said, he surely is at risk for ventricular arrhythmias given his nonischemic cardiomyopathy. I also do not believe he has a clinically significant left lower lobe pneumonia despite the portable chest x-ray as he  has absolutely no symptoms to suggest this to be the case. He does have a symptomatic urinary tract infection and the urine culture is pending, but I do not believe that his presentation is consistent with a urobacteremia and associated sepsis. Unfortunately, we are unable to hold his antihypertensive medications as they are being used for nodal blockade and rate control. His digoxin level is in the therapeutic range. The sotalol, if it is used for the atrial fibrillation, has been ineffective as he is in atrial fibrillation rather than a normal sinus rhythm. Therefore, if there is no other indication for this medication, his primary care provider could consider stopping this medication which may help with the hypotension. It also may allow for escalation of the diltiazem dose or initiation of another nodal blocker such as metoprolol in the future. Because we are not exactly sure of the indication for the sotalol we have decided against stopping it here and only raise this point be considered back at the nursing facility.  1) Presumed syncopal event secondary to presumed dehydration: We have stopped the Lasix and do not recommend starting it again. The lower extremity edema has been long-standing and is not associated with orthopnea, paroxysmal nocturnal dyspnea, or other  signs and symptoms of decompensated left ventricular heart failure that we can determine. If the sotalol is for rhythm control it has been ineffective and can be discontinued. We will defer this decision to the primary provider as were not sure that is the indication for the sotalol. We have to continue the digoxin and the diltiazem at the current doses in order to maintain rate control for the atrial fibrillation. Further adjustments in the nodal blockade can be as needed in the outpatient setting.  2) Symptomatic urinary tract infection: We have started antibiotics for the urinary tract infection. In the off chance he also has a  community-acquired pneumonia given the radiograph this should cover it as well. Again, clinically he does not have a pneumonia.  3) Disposition: He is being discharged back to the skilled nursing facility for continued rehabilitation and adjustments in his chronic medical regimen.

## 2017-01-24 NOTE — Clinical Social Work Note (Signed)
Clinical Social Work Assessment  Patient Details  Name: Devin Scott MRN: [id number] Date of Birth: [birth year]  Date of referral:  01/24/17               Reason for consult:  Facility Placement                Permission sought to share information with:  Family Supports Permission granted to share information::     Name::     Network engineer::     Relationship::  Daughter  Contact Information:     Housing/Transportation Living arrangements for the past 2 months:  [place] of Information:  Adult Children Patient Interpreter Needed:  None Criminal Activity/Legal Involvement Pertinent to Current Situation/Hospitalization:  No - Comment as needed Significant Relationships:  Adult Children, Spouse Lives with:  Spouse Do you feel safe going back to the place where you live?    Need for family participation in patient care:     Care giving concerns:  Pt is only alert to self and place. CSW attempted to reach pt's wife, was unsuccessful, however, did reach pt's daughter via telephone.  Social Worker assessment / plan:  CSW spoke with pt's daughter via telephone. Pt was previously at [facility]. According to [facility] pt's wife did a bed hold. However, after talking to pt's daughter per a conversation pt's spouse and daughter had they would like to see if pt could go to [facility] or [facility]. Pt's daughter states [facility] is not out of the picture they just prefer either of those facilities to be explored before the pt is returned to [facility]. CSW will contact facilities to determine bed availability and follow up with family.   Employment status:  Retired Forensic scientist:  Medicare PT Recommendations:  [place] / Referral to community resources:  [place]  Patient/Family's Response to care:  Pt's daughter verbalized understanding of CSW role and expressed appreciation for support. Pt's  daughter denies any concern regarding pt care at this time.   Patient/Family's Understanding of and Emotional Response to Diagnosis, Current Treatment, and Prognosis:  Pt's daughter understanding and realistic regarding pt's physical limitations. Pt's daughter understands the need for SNF placement for pt at d/c. Pt's daughter agreeable to SNF placement for pt at d/c, at this time.  Pt's daughter denies any concern regarding pt's treatment plan at this time. CSW will continue to provide support and facilitate d/c needs.   Emotional Assessment Appearance:  Appears stated age Attitude/Demeanor/Rapport:  Unable to Assess Affect (typically observed):  Unable to Assess Orientation:  Oriented to Self, Oriented to Place Alcohol / Substance use:  Not Applicable Psych involvement (Current and /or in the community):  No (Comment)  Discharge Needs  Concerns to be addressed:  Care Coordination Readmission within the last 30 days:  Yes [facility]) Current discharge risk:  Dependent with Mobility Barriers to Discharge:  Continued Medical Work up   [employer], LCSW 01/24/2017, 11:19 AM

## 2017-01-24 NOTE — Progress Notes (Addendum)
   Subjective: Devin Scott is doing well this morning. No acute events overnight. Denies further syncopal episodes. Telemetry was reviewed and showed no ventricular arrhythmias. Patient does note some burning with urination. Patient denies PND or orthopnea. Wife notes that he was started on lasix recently (within the last week).  Objective:  Vital signs in last 24 hours: Vitals:   01/23/17 2126 01/24/17 0103 01/24/17 0442 01/24/17 1500  BP: 113/77 101/76 112/81 103/79  Pulse: 86 (!) 115 88 86  Resp: 20 18 18    Temp: 98.3 F (36.8 C) 98.4 F (36.9 C) 98.2 F (36.8 C)   TempSrc: Oral Oral Oral   SpO2: 99% 99% 99% 98%  Weight:   158 lb 9.6 oz (71.9 kg)    GEN: elderly gentleman lying in bed in NAD EYES: EOMI. Sclera anicteric. RESP: Clear to auscultation bilaterally. No wheezes or rales. CV: Irregularly irregular rhythm. Tachycardic. No murmurs. 3+ BLE edema to knees. ABD: Soft. Non-tender. Non-distended. Normoactive bowel sounds. EXT: BLE edema to knees. 1+ DP and radial pulses. Minimal movement of L arm (recent history of L humeral fracture). NEURO: Hearing loss. Other cranial nerves grossly intact. No focal deficits or weakness.  Assessment/Plan:  Active Problems:   Syncope  # Syncope Likely secondary to dehydration from recent initiation of lasix. Per wife, he was started on lasix about a week ago. He does endorse being thirsty. We are hesitant to start fluids on him given his lower extremity edema, so we will allow him to drink a reasonable amount of fluids as he desires. Afib may be contributing, however, it is difficult to ascertain the timing as history is difficult and at times conflicting from patient and wife. Patient is feeling back to normal now. - Hold lasix - F/u echo - Continue digoxin, sotalol, and diltiazem - Telemetry - drink fluids as desired  # Symptomatic UTI Patient notes some burning with urination. UA with moderate leukocytes, 6-30 WBC, few  bacteria, and negative nitrites. - 1 dose of IV ceftriaxone in hospital (no oral form of cefdinir in hospital formulary) - discharge home with oral cefdinir 300mg  x 5d - Urine culture  # Persistent Afib with RVR HR improved after diltiazem, digoxin, and sotalol. Patient denies chest pain or palpitations. Telemetry with no ventricular arrhythmias. CHADS2VASc score of 4. - Continue digoxin, sotalol, and diltiazem - Telemetry - Cont home warfarin - Consider discontinuing sotalol at next PCP visit  # HFrEF (last EF 20%) Patient with history of chronic HFrEF. Last EF from note in 09/2016 was reportedly 20%. No PND or orthopnea. Patient is thirsty, will give him fluids as he desires. Lower extremity edema likely related to chronic venous insufficiency more than heart failure given his lack of other symptoms. - F/u echo - Hold lasix - Drink fluids as desired  # HTN History of HTN per chart. Blood pressures low in the hospital (100s/80s). - Consider discontinuing sotalol. However, will defer this decision to PCP. - discharge today on home meds  Dispo: to home today  Colbert Ewing, MD 01/24/2017, 4:20 PM Pager: [facility]ie Nick [phone]

## 2017-01-24 NOTE — Evaluation (Signed)
Physical Therapy Evaluation Patient Details Name: Devin Scott MRN: [id number] DOB: [birth year] Today's Date: 01/24/2017   History of Present Illness  83 yo male with onset of syncopal episode in SNF, had L humeral fracture with edematous and macerated skin on L forearm.  Noted dehydration and CHF with PMHx:  a-fib, osteoporosis, atherosclerosis, cardiomegaly, HTN, EF 20%  Clinical Impression  Pt was able to assist with mobility back to bed but sitting up is fairly dependent.  Wife there to provide history as pt is very poor with hearing and cannot process all the comments and questions.  He will be seen for acute therapy as tolerated to work on sitting control and noted BP readings as follow:  Supine BP was 92/75 with pulse 81,  Sitting BP was 88/71 with pulse 77.  However, when trendelenburg used to scoot up in bed he went to pulse 115.      Follow Up Recommendations SNF    Equipment Recommendations  None recommended by PT    Recommendations for Other Services       Precautions / Restrictions Precautions Precautions: Fall Precaution Comments: L humeral fracture Required Braces or Orthoses: Other Brace/Splint (clamshell on L upper arm for humeral fracture) Restrictions Weight Bearing Restrictions: Yes LUE Weight Bearing: Non weight bearing      Mobility  Bed Mobility Overal bed mobility: Needs Assistance Bed Mobility: Supine to Sit;Sit to Supine     Supine to sit: Max assist Sit to supine: Mod assist;Max assist   General bed mobility comments: PT assisted at trunk and assisted to slide out to edge of bed but used pillow under LUE as a slide device to move it  Transfers                 General transfer comment: pt is too weak to stand'  Ambulation/Gait             General Gait Details: unable, weak  Stairs            Wheelchair Mobility    Modified Rankin (Stroke Patients Only)       Balance Overall balance assessment: History of Falls;Needs  assistance Sitting-balance support: Feet supported;Single extremity supported Sitting balance-Leahy Scale: Poor Sitting balance - Comments: fair once set but then drifts back                                     Pertinent Vitals/Pain Pain Assessment: Faces Pain Score: 8  Faces Pain Scale: Hurts whole lot Pain Location: LUE with any movement Pain Intervention(s): Limited activity within patient's tolerance;Premedicated before session;Monitored during session;Repositioned    Home Living Family/patient expects to be discharged to:: Skilled nursing facility                 Additional Comments: was just being treated at [facility]     Prior Function Level of Independence: Independent with assistive device(s)               Hand Dominance   Dominant Hand: Right    Extremity/Trunk Assessment   Upper Extremity Assessment Upper Extremity Assessment: Generalized weakness    Lower Extremity Assessment Lower Extremity Assessment: Generalized weakness    Cervical / Trunk Assessment Cervical / Trunk Assessment: Kyphotic  Communication   Communication: HOH (has not had his hearing aids fitted yet)  Cognition Arousal/Alertness: Awake/alert Behavior During Therapy: Anxious Overall Cognitive Status: Within Functional Limits for tasks  assessed                                 General Comments: pt was mainly worried he was being asked to sacrifice too much energy to PT eval, then was OK with wife there to observe      General Comments General comments (skin integrity, edema, etc.): LUE has edema and maceration on skin, very fragile and avoided contact by using pillow to move on the bed    Exercises     Assessment/Plan    PT Assessment Patient needs continued PT services  PT Problem List Decreased strength;Decreased range of motion;Decreased activity tolerance;Decreased balance;Decreased mobility;Decreased coordination;Decreased safety  awareness;Cardiopulmonary status limiting activity;Decreased skin integrity;Pain       PT Treatment Interventions DME instruction;Gait training;Functional mobility training;Therapeutic activities;Therapeutic exercise;Balance training;Neuromuscular re-education;Patient/family education    PT Goals (Current goals can be found in the Care Plan section)  Acute Rehab PT Goals Patient Stated Goal: to rest and get stronger PT Goal Formulation: With patient/family Time For Goal Achievement: 02/07/17 Potential to Achieve Goals: Good    Frequency Min 2X/week   Barriers to discharge Decreased caregiver support home with wife alone and needs 2 person assist    Co-evaluation               AM-PAC PT "6 Clicks" Daily Activity  Outcome Measure Difficulty turning over in bed (including adjusting bedclothes, sheets and blankets)?: Total Difficulty moving from lying on back to sitting on the side of the bed? : Total Difficulty sitting down on and standing up from a chair with arms (e.g., wheelchair, bedside commode, etc,.)?: Total Help needed moving to and from a bed to chair (including a wheelchair)?: Total Help needed walking in hospital room?: Total Help needed climbing 3-5 steps with a railing? : Total 6 Click Score: 6    End of Session   Activity Tolerance: Patient limited by fatigue;Patient limited by pain Patient left: in bed;with call bell/phone within reach;with bed alarm set;with family/visitor present;with nursing/sitter in room Nurse Communication: Mobility status PT Visit Diagnosis: Muscle weakness (generalized) (M62.81);History of falling (Z91.81);Difficulty in walking, not elsewhere classified (R26.2);Adult, failure to thrive (R62.7);Pain Pain - Right/Left: Left Pain - part of body: Arm    Time: 0902-0940 PT Time Calculation (min) (ACUTE ONLY): 38 min   Charges:   PT Evaluation $PT Eval Moderate Complexity: 1 Mod PT Treatments $Therapeutic Exercise: 8-22  mins $Therapeutic Activity: 8-22 mins   PT G Codes:   PT G-Codes **NOT FOR INPATIENT CLASS** Functional Assessment Tool Used: AM-PAC 6 Clicks Basic Mobility;Clinical judgement Functional Limitation: Changing and maintaining body position Changing and Maintaining Body Position Current Status (O8875): At least 80 percent but less than 100 percent impaired, limited or restricted Changing and Maintaining Body Position Goal Status (Z9728): At least 20 percent but less than 40 percent impaired, limited or restricted    Ramond Dial 01/24/2017, 10:41 AM   Mee Hives, PT MS Acute Rehab Dept. Number: [place] [facility] [place]

## 2017-01-24 NOTE — Progress Notes (Signed)
  Echocardiogram 2D Echocardiogram has been performed.  Devin Scott 01/24/2017, 3:38 PM

## 2017-01-24 NOTE — NC FL2 (Signed)
[place] MEDICAID FL2 LEVEL OF CARE SCREENING TOOL     IDENTIFICATION  Patient Name: Devin Scott Birthdate: Aug 02, 1928 Sex: male Admission Date (Current Location): 01/23/2017  [facility] [place] Number:  Herbalist and Address:  [facility] [place] [address] [place], [place] [address]      Provider Number: [id number]  Attending Physician Name and Address:  Oval Linsey, MD  Relative Name and Phone Number:       Current Level of Care: Hospital Recommended Level of Care: [place] Prior Approval Number:    Date Approved/Denied:   PASRR Number: [id number]  Discharge Plan: SNF    Current Diagnoses: Patient Active Problem List   Diagnosis Date Noted  . Syncope 01/23/2017  . Displaced comminuted fracture of shaft of left humerus with routine healing 01/10/2017    Orientation RESPIRATION BLADDER Height & Weight     Self, Place  Normal Incontinent Weight: 158 lb 9.6 oz (71.9 kg) Height:     BEHAVIORAL SYMPTOMS/MOOD NEUROLOGICAL BOWEL NUTRITION STATUS      Incontinent  (Please see d.c summary)  AMBULATORY STATUS COMMUNICATION OF NEEDS Skin   Extensive Assist Verbally Other (Comment) (Open wound, left arm skin tear, foam dressing, change PRN)                       Personal Care Assistance Level of Assistance  Bathing, Feeding, Dressing Bathing Assistance: Maximum assistance Feeding assistance: Limited assistance Dressing Assistance: Maximum assistance     Functional Limitations Info  Sight, Hearing, Speech Sight Info: Adequate Hearing Info: Impaired Speech Info: Adequate    SPECIAL CARE FACTORS FREQUENCY  PT (By licensed PT), OT (By licensed OT)     PT Frequency: 2x week OT Frequency: 2x week            Contractures Contractures Info: Not present    Additional Factors Info  Code Status, Allergies, Isolation Precautions Code Status Info: Full Code Allergies Info: Percocet  Oxycodone-acetaminophen     Isolation Precautions Info: Contact     Current Medications (01/24/2017):  This is the current hospital active medication list Current Facility-Administered Medications  Medication Dose Route Frequency Provider Last Rate Last Dose  . 0.9 %  sodium chloride infusion  250 mL Intravenous PRN Maryellen Pile, MD      . acetaminophen (TYLENOL) tablet 1,000 mg  1,000 mg Oral Q6H PRN Maryellen Pile, MD   1,000 mg at 01/23/17 1924  . celecoxib (CELEBREX) capsule 100 mg  100 mg Oral BID Maryellen Pile, MD   100 mg at 01/24/17 1057  . digoxin (LANOXIN) tablet 0.125 mg  0.125 mg Oral Daily Nanavati, Ankit, MD   0.125 mg at 01/24/17 1039  . diltiazem (CARDIZEM) tablet 60 mg  60 mg Oral TID Varney Biles, MD   60 mg at 01/24/17 1039  . sodium chloride flush (NS) 0.9 % injection 3 mL  3 mL Intravenous Q12H Maryellen Pile, MD   3 mL at 01/24/17 1000  . sodium chloride flush (NS) 0.9 % injection 3 mL  3 mL Intravenous Q12H Maryellen Pile, MD   3 mL at 01/24/17 1000  . sodium chloride flush (NS) 0.9 % injection 3 mL  3 mL Intravenous PRN Maryellen Pile, MD      . sotalol (BETAPACE) tablet 120 mg  120 mg Oral BID Kathrynn Humble, Ankit, MD   120 mg at 01/24/17 1039  . traMADol (ULTRAM) tablet 50 mg  50 mg Oral  Q6H PRN Maryellen Pile, MD      . warfarin (COUMADIN) tablet 2.5 mg  2.5 mg Oral q1800 Nanavati, Ankit, MD   2.5 mg at 01/23/17 1947  . Warfarin - Physician Dosing Inpatient   Does not apply q1800 [place], Valeda Malm [facility]         Discharge Medications: Please see discharge summary for a list of discharge medications.  Relevant Imaging Results:  Relevant Lab Results:   Additional Information SSN: [id number]  Eileen Stanford, LCSW

## 2017-01-24 NOTE — Care Management Obs Status (Addendum)
[place] NOTIFICATION   Patient Details  Name: Devin Scott MRN: [id number] Date of Birth: [birth year]   Medicare Observation Status Notification Given:  Yes signed by wife, patient asleep and she did not want to wake him up.    Carles Collet, RN 01/24/2017, 10:00 AM

## 2017-01-31 ENCOUNTER — Telehealth (INDEPENDENT_AMBULATORY_CARE_PROVIDER_SITE_OTHER): Payer: Self-pay | Admitting: Orthopaedic Surgery

## 2017-01-31 NOTE — Telephone Encounter (Signed)
Returned call to patient left message to call back. 

## 2017-02-01 ENCOUNTER — Ambulatory Visit (INDEPENDENT_AMBULATORY_CARE_PROVIDER_SITE_OTHER): Payer: No Typology Code available for payment source

## 2017-02-01 ENCOUNTER — Ambulatory Visit (INDEPENDENT_AMBULATORY_CARE_PROVIDER_SITE_OTHER): Payer: Medicare Other | Admitting: Orthopaedic Surgery

## 2017-02-01 ENCOUNTER — Encounter (INDEPENDENT_AMBULATORY_CARE_PROVIDER_SITE_OTHER): Payer: Self-pay | Admitting: Orthopaedic Surgery

## 2017-02-01 DIAGNOSIS — S42352D Displaced comminuted fracture of shaft of humerus, left arm, subsequent encounter for fracture with routine healing: Secondary | ICD-10-CM

## 2017-02-01 NOTE — Progress Notes (Signed)
Devin Scott is now over 2 months into a comminuted humeral shaft fracture of his left humerus. We have functional fracture brace and is doing well from that standpoint however cognitively and in general he is not doing well. 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  On examination his radial nerve is still out in terms of motor function. He tolerates to move and his arm is unit though more in terms of the humerus and the functional fracture brace is in a good position. He does have dependent edema in his hand. X-rays of the humerus show the fracture is at interval healing and in good alignment with the functional fracture brace.  At this point a continued brace for one more month. We'll see him back in a month for repeat AP and lateral of his left humerus. I gave notes with a nursing facility to consider a compressive glove for the hand edema.

## 2017-03-02 ENCOUNTER — Ambulatory Visit (INDEPENDENT_AMBULATORY_CARE_PROVIDER_SITE_OTHER): Payer: Medicare Other | Admitting: Orthopaedic Surgery

## 2017-03-08 ENCOUNTER — Ambulatory Visit (INDEPENDENT_AMBULATORY_CARE_PROVIDER_SITE_OTHER): Payer: Medicare Other | Admitting: Orthopaedic Surgery

## 2017-03-08 ENCOUNTER — Encounter (INDEPENDENT_AMBULATORY_CARE_PROVIDER_SITE_OTHER): Payer: Self-pay

## 2017-03-20 ENCOUNTER — Encounter (INDEPENDENT_AMBULATORY_CARE_PROVIDER_SITE_OTHER): Payer: Self-pay

## 2017-03-20 ENCOUNTER — Ambulatory Visit (INDEPENDENT_AMBULATORY_CARE_PROVIDER_SITE_OTHER): Payer: Medicare Other | Admitting: Orthopaedic Surgery

## 2017-06-20 DEATH — deceased

## 2018-06-25 IMAGING — CR DG CHEST 1V
1 series · 1 of 1 positions shown · non-contrast
Comparison: None.

CLINICAL DATA: Hypertension.  Aspiration

EXAM:
CHEST 1 VIEW

[chest ap]
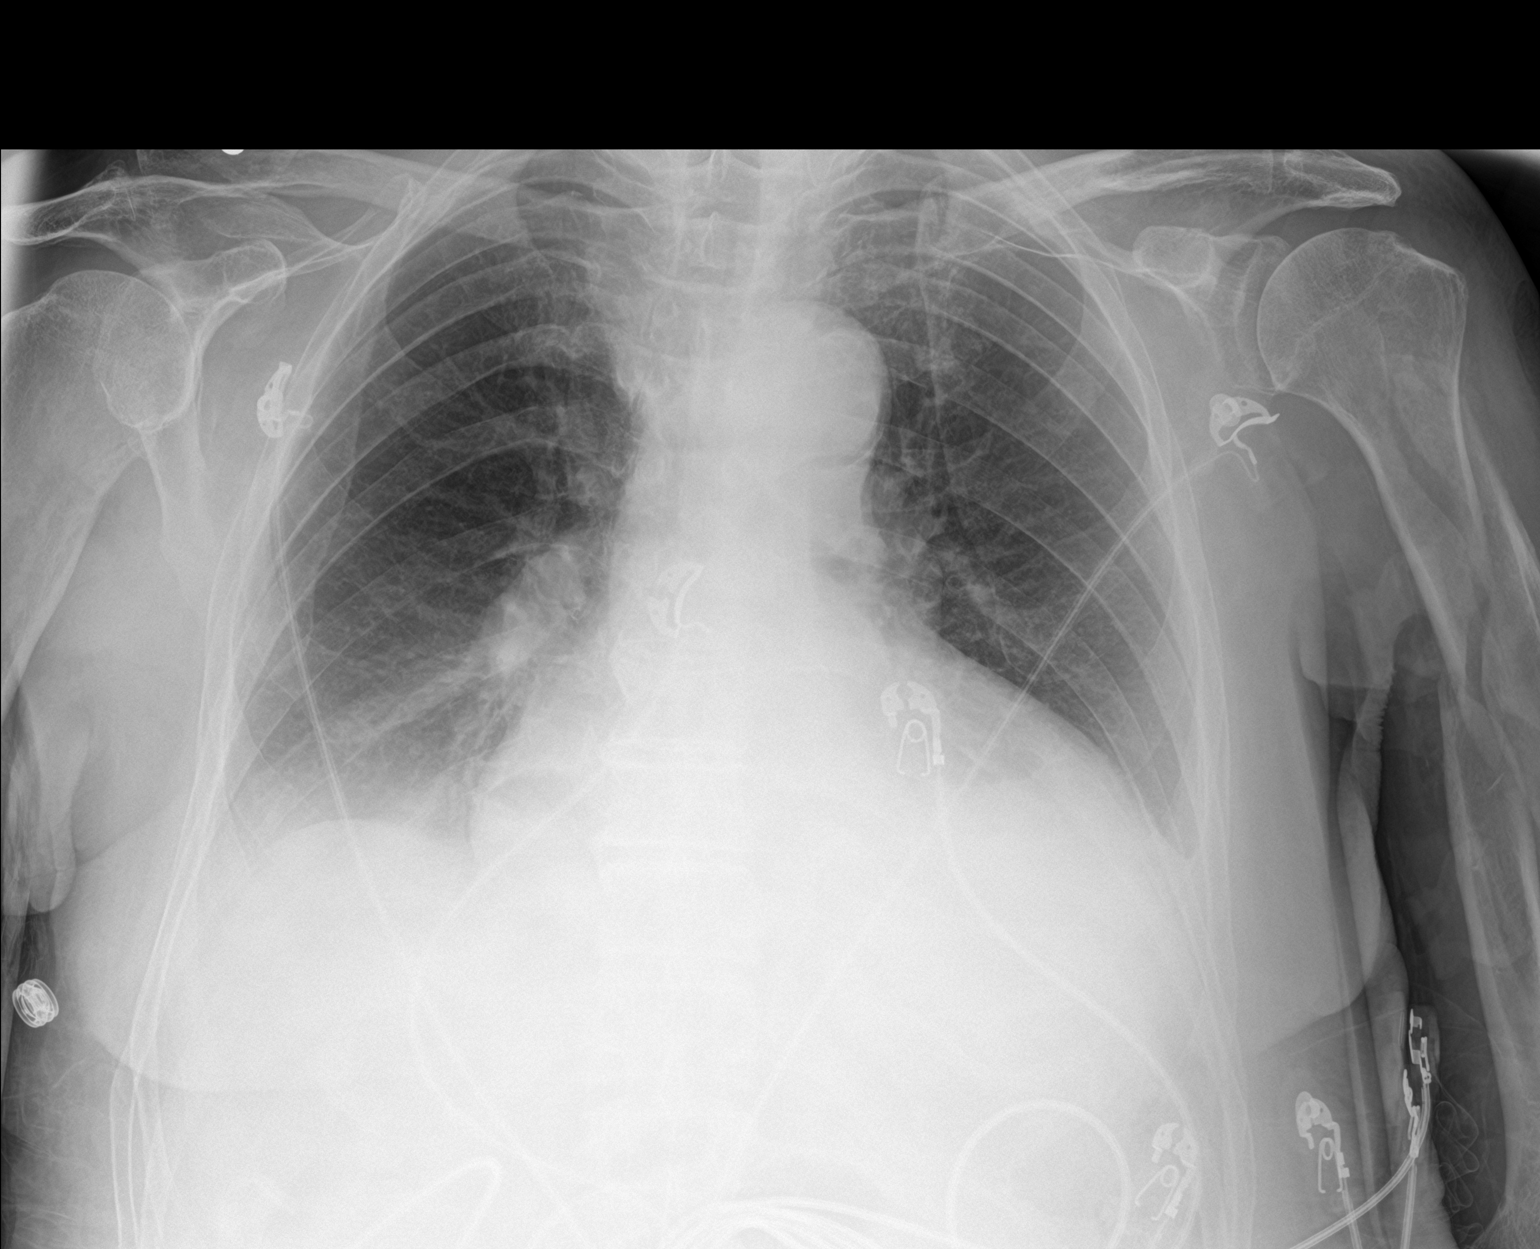

[1 of 1 positions shown; findings below may reference images not displayed]

FINDINGS: There is a focus of consolidation in the left base, concerning for
pneumonia or aspiration. There is a small pleural effusion on the
left. There is a small right pleural effusion without edema or
consolidation appreciable. There is cardiomegaly with pulmonary
vascularity within normal limits. There is aortic atherosclerosis.
No adenopathy. Bones are osteoporotic. There is a comminuted
fracture of the left proximal to mid humerus.
IMPRESSION: Focal consolidation left base. Question aspiration as a differential
consideration. Small left pleural effusion. Small right pleural
effusion.

Cardiomegaly with aortic atherosclerosis.

Comminuted fracture proximal mid left humerus.

Aortic Atherosclerosis ([id number]).
